# Patient Record
Sex: Male | Born: 1950 | Race: White | Hispanic: No | Marital: Single | Smoking: Never smoker
Health system: Southern US, Community
[De-identification: ages and names within clinical notes are randomized; demographics above are authoritative.]

## PROBLEM LIST (undated history)

## (undated) DIAGNOSIS — G8929 Other chronic pain: Secondary | ICD-10-CM

## (undated) DIAGNOSIS — I10 Essential (primary) hypertension: Secondary | ICD-10-CM

## (undated) DIAGNOSIS — E785 Hyperlipidemia, unspecified: Secondary | ICD-10-CM

## (undated) DIAGNOSIS — R51 Headache: Secondary | ICD-10-CM

## (undated) HISTORY — DX: Essential (primary) hypertension: I10

## (undated) HISTORY — DX: Other chronic pain: G89.29

## (undated) HISTORY — DX: Hyperlipidemia, unspecified: E78.5

## (undated) HISTORY — DX: Headache: R51

---

## 1980-10-05 HISTORY — PX: OTHER SURGICAL HISTORY: SHX169

## 1999-10-06 HISTORY — PX: REPAIR KNEE LIGAMENT: SUR1188

## 2004-11-19 ENCOUNTER — Ambulatory Visit: Payer: Self-pay | Admitting: Gastroenterology

## 2007-04-15 ENCOUNTER — Ambulatory Visit: Payer: Self-pay | Admitting: Unknown Physician Specialty

## 2009-03-06 ENCOUNTER — Ambulatory Visit: Payer: Self-pay | Admitting: Otolaryngology

## 2010-01-06 ENCOUNTER — Emergency Department: Payer: Self-pay | Admitting: Emergency Medicine

## 2011-02-02 IMAGING — CR DG ABDOMEN 3V
1 series · 5 of 5 positions shown · non-contrast
Comparison: none

REASON FOR EXAM: pain
COMMENTS:

[Series 1: view not recorded · 0.17mm/px · 5 of 5 slices shown]
[im 1/5]
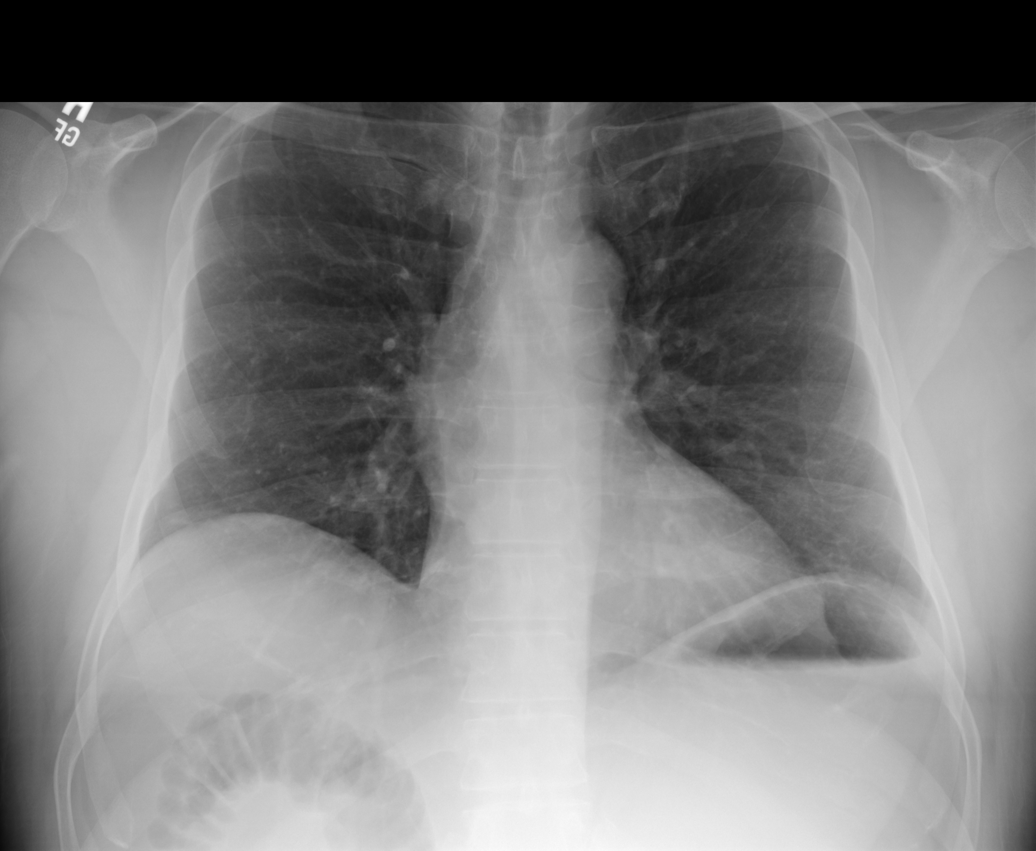
[im 2/5]
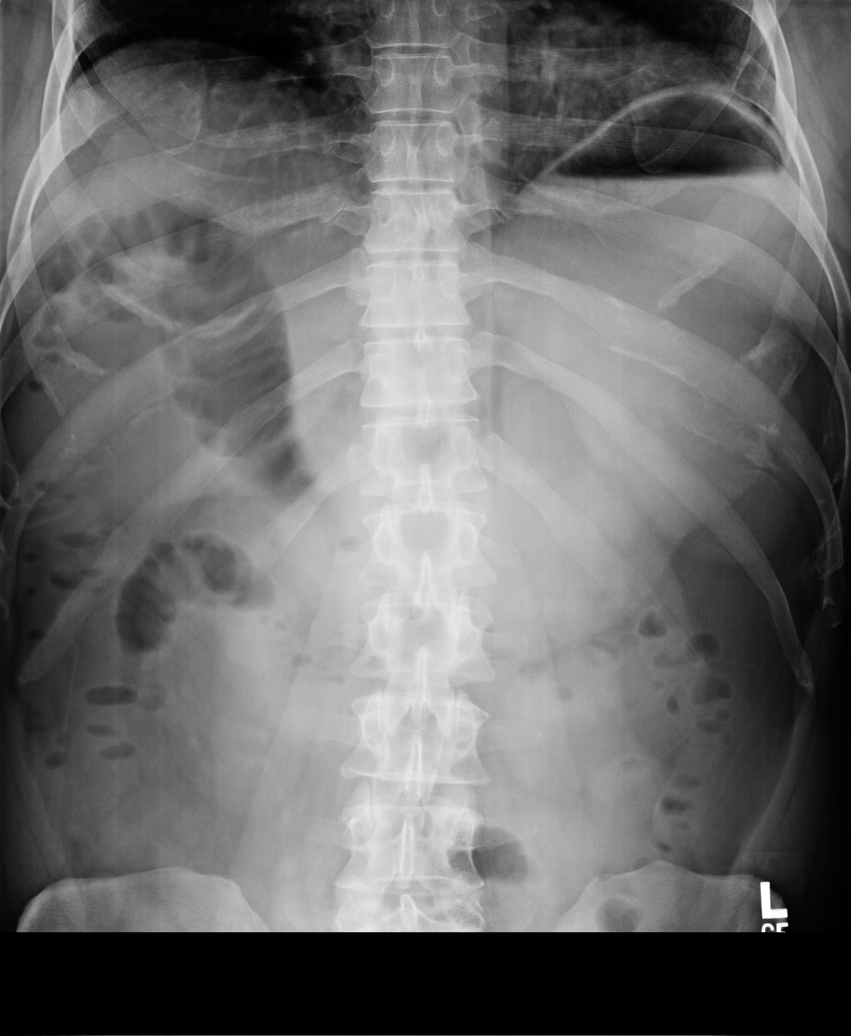
[im 3/5]
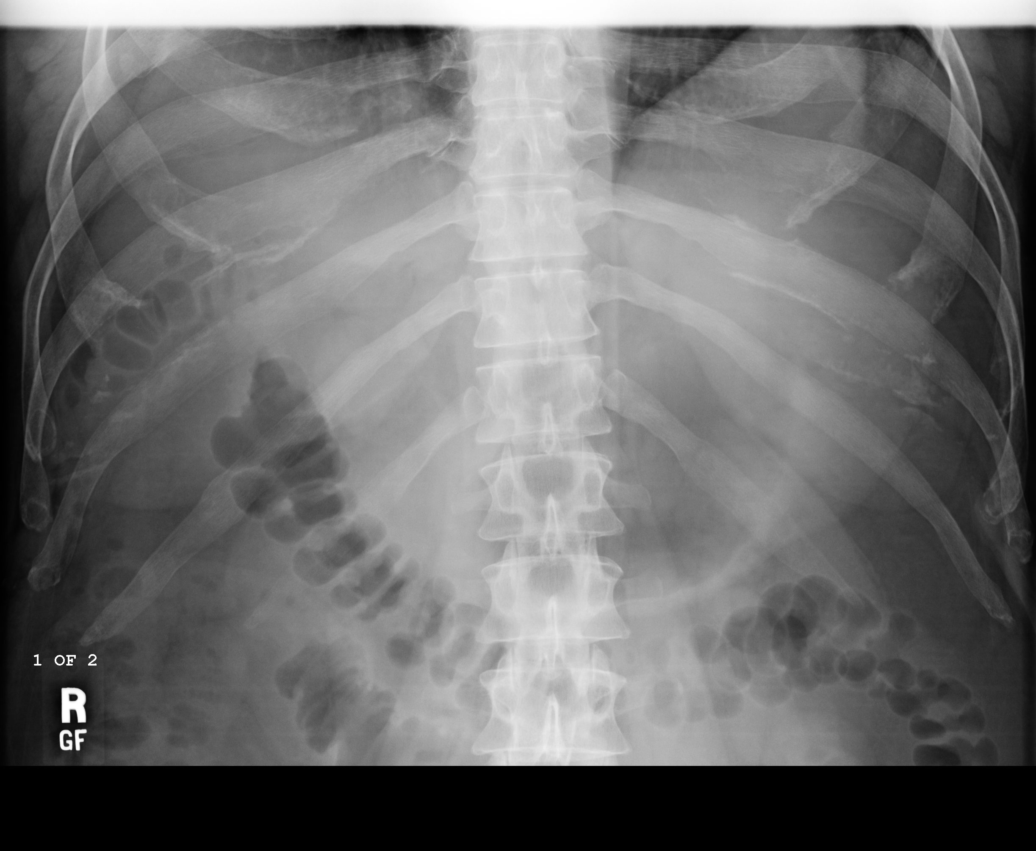
[im 4/5]
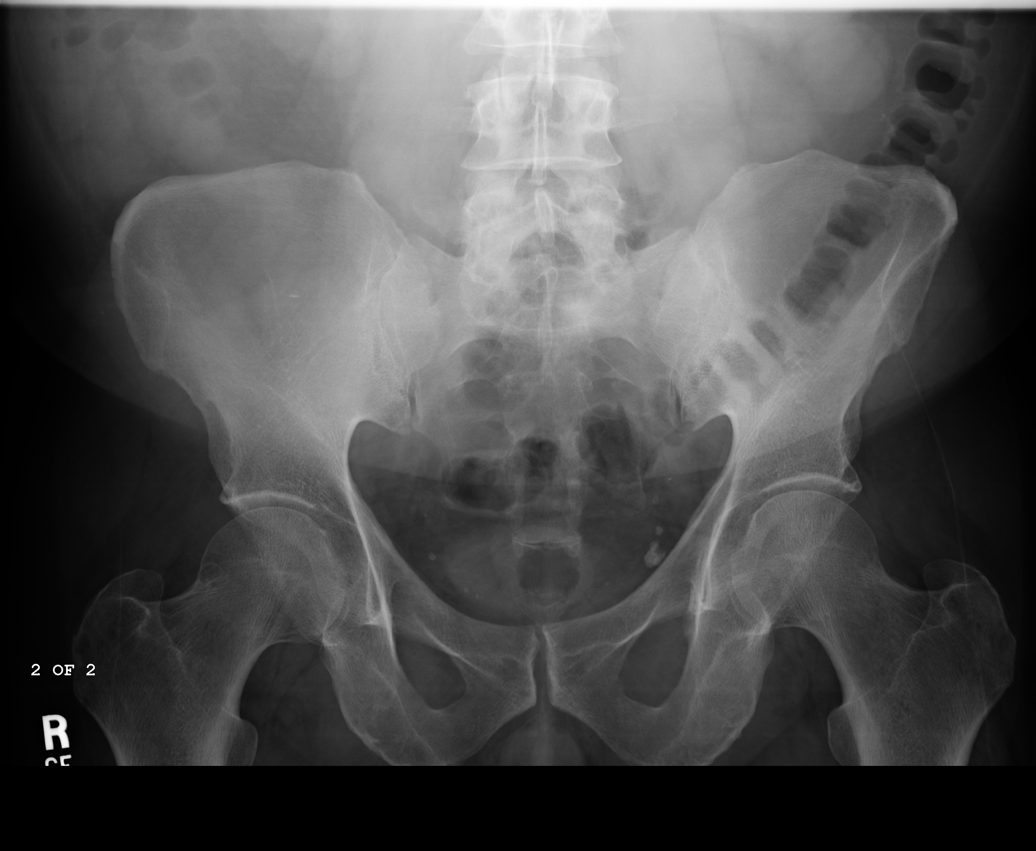
[im 5/5]
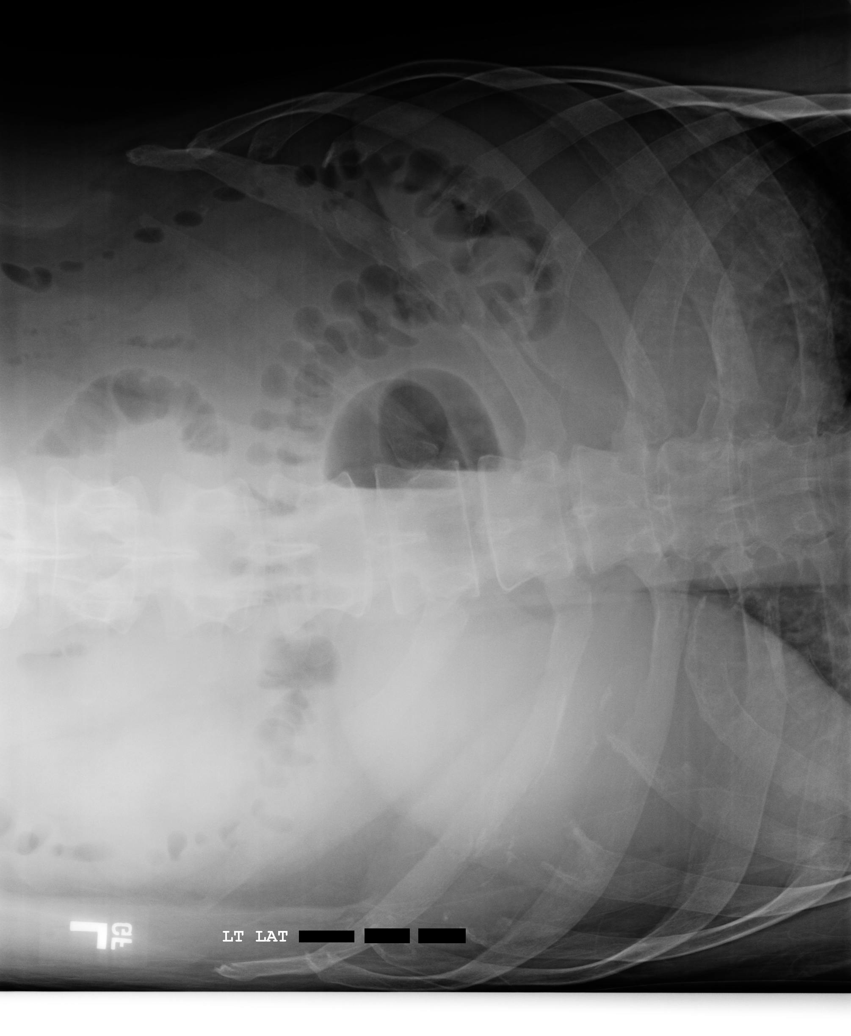

[5 of 5 positions shown; findings below may reference images not displayed]

PROCEDURE:     DXR - DXR ABDOMEN 3-WAY (INCL PA CXR)  - January 07, 2010  [DATE]

RESULT:     The lung fields are clear. The heart size is normal.

Four views of the abdomen are obtained. No subdiaphragmatic free air is
seen. Gas is visualized in both the large and small intestine. No bowel
obstruction is seen. The stomach is fluid filled and mildly distended.
Nonspecific air/fluid levels are seen in the duodenum and proximal small
bowel. The findings may be transient. Intra-abdominal inflammation such as
inflamed pancreas cannot be entirely excluded from the differential. No
abnormal intra-abdominal calcifications are seen. The osseous structures are
normal in appearance.
IMPRESSION: 1.  No subdiaphragmatic free air is seen.
2.  No bowel obstruction is noted.
3.  There are nonspecific fluid levels in the duodenum and proximal small
bowel. As mentioned, the findings may be transient but inflammation such as
inflamed pancreas cannot be excluded on the basis of this exam.
4.  The stomach is fluid filled and mildly distended.
5.  The lung fields are clear.

## 2011-05-12 ENCOUNTER — Ambulatory Visit: Payer: Self-pay | Admitting: Unknown Physician Specialty

## 2012-03-30 ENCOUNTER — Encounter (INDEPENDENT_AMBULATORY_CARE_PROVIDER_SITE_OTHER): Payer: Self-pay

## 2012-04-13 ENCOUNTER — Encounter (INDEPENDENT_AMBULATORY_CARE_PROVIDER_SITE_OTHER): Payer: Self-pay | Admitting: Surgery

## 2012-04-20 ENCOUNTER — Ambulatory Visit (INDEPENDENT_AMBULATORY_CARE_PROVIDER_SITE_OTHER): Payer: BC Managed Care – PPO | Admitting: Surgery

## 2012-05-11 ENCOUNTER — Ambulatory Visit (INDEPENDENT_AMBULATORY_CARE_PROVIDER_SITE_OTHER): Payer: BC Managed Care – PPO | Admitting: General Surgery

## 2012-05-11 ENCOUNTER — Encounter (INDEPENDENT_AMBULATORY_CARE_PROVIDER_SITE_OTHER): Payer: Self-pay | Admitting: General Surgery

## 2012-05-11 VITALS — BP 118/78 | HR 68 | Temp 97.3°F | Resp 16 | Ht 72.0 in | Wt 210.6 lb

## 2012-05-11 DIAGNOSIS — R51 Headache: Secondary | ICD-10-CM

## 2012-05-11 NOTE — Progress Notes (Signed)
Chief complaint: Right-sided headaches, rule out temporal arteritis  History: Patient is a 61 year old male referred by Dr. Marjory Lies for temporal artery biopsy. He has a long history of right-sided temporal headaches and tenderness over his right temple. He also has some history of recent vision changes. He was evaluated by his neurologist with negative workup and there is concern for temporal arteritis. He was placed on prednisone and initially did not have a response but with more long-term treatment has noted improvement in his symptoms.  Past Medical History  Diagnosis Date  . Hypertension   . Hyperlipidemia   . Chronic headaches    Past Surgical History  Procedure Date  . Left arm surgery 1982  . Repair knee ligament 2001   Current Outpatient Prescriptions  Medication Sig Dispense Refill  . aspirin 81 MG tablet Take 81 mg by mouth daily.      . hydrochlorothiazide (HYDRODIURIL) 25 MG tablet Take 25 mg by mouth daily.      Marland Kitchen lisinopril (PRINIVIL,ZESTRIL) 40 MG tablet Take 40 mg by mouth daily.      . metoprolol succinate (TOPROL-XL) 100 MG 24 hr tablet Take 100 mg by mouth daily. Take with or immediately following a meal.      . niacin (NIASPAN) 1000 MG CR tablet Take 1,000 mg by mouth at bedtime.      Marland Kitchen omeprazole (PRILOSEC) 20 MG capsule Take 20 mg by mouth daily.      Marland Kitchen PREDNISONE PO Take 30 mg by mouth daily.      . SUMAtriptan (IMITREX) 50 MG tablet Take 50 mg by mouth every 2 (two) hours as needed.       No Known Allergies Exam: Gen.: Healthy-appearing Caucasian male Pertinent exam today significant for a palpable right temporal artery. His area of headache and pain is somewhat anterior to this  Assessment and plan: Rule out temporal arteritis. We discussed right temporal artery biopsy including the nature of the surgery and recovery and risks of bleeding and infection and indications for the surgery all their questions were answered. We'll schedule his convenience.

## 2012-06-01 ENCOUNTER — Ambulatory Visit (INDEPENDENT_AMBULATORY_CARE_PROVIDER_SITE_OTHER): Payer: BC Managed Care – PPO | Admitting: Surgery

## 2012-06-06 IMAGING — CT CT PARANASAL SINUSES W/O CM
1 series · 15 of 30 positions shown, 19 images · non-contrast
Comparison: none

REASON FOR EXAM: recurrent sinusitis headaches
COMMENTS:

[Series 2: osteo (id) · axial · 0.40mm/px · z∈[-104,+74]mm · 15 of 129 slices shown, 19 images]
[im 5/129  brain]
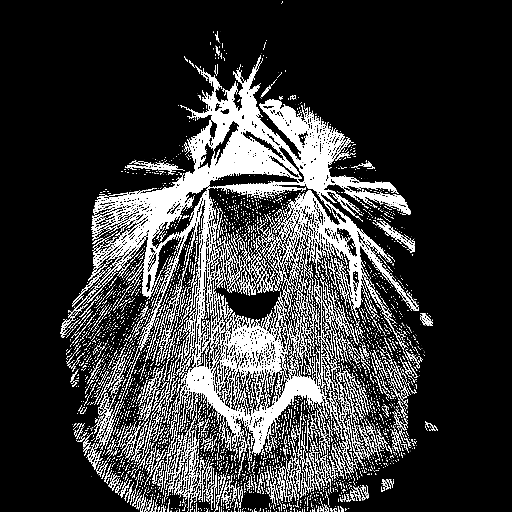
[im 5/129  bone]
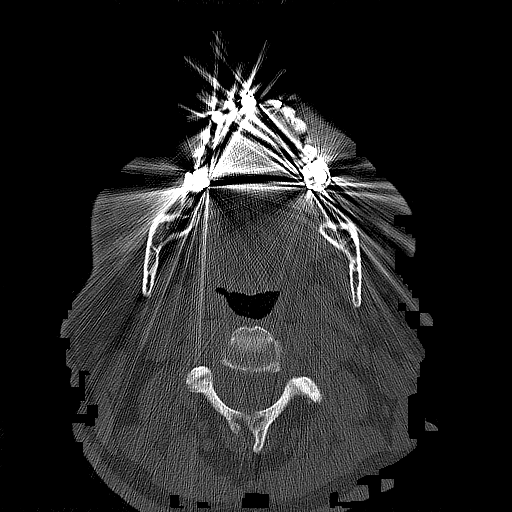
[im 14/129  bone]
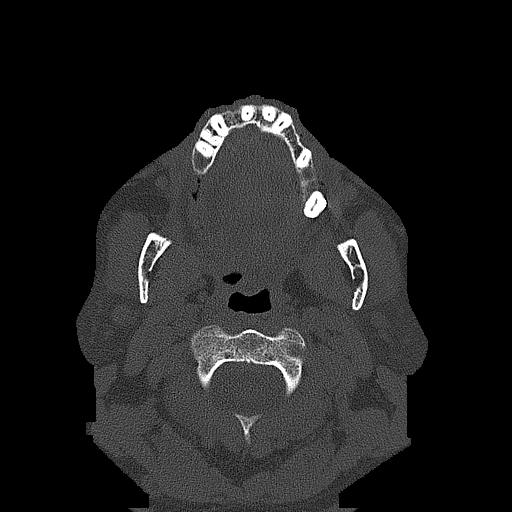
[im 23/129  bone]
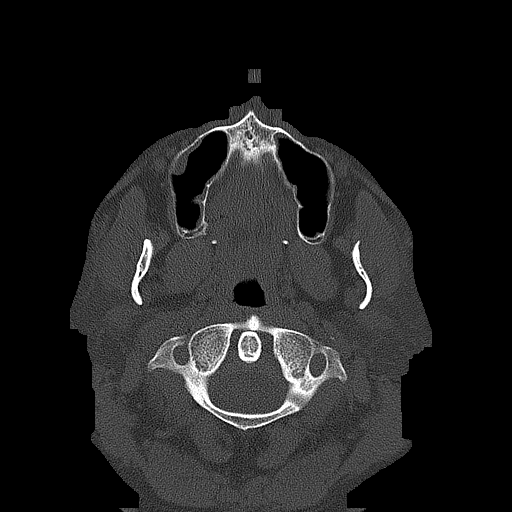
[im 31/129  bone]
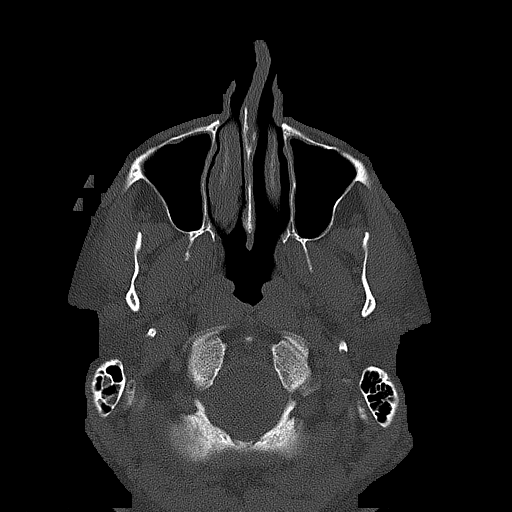
[im 40/129  brain]
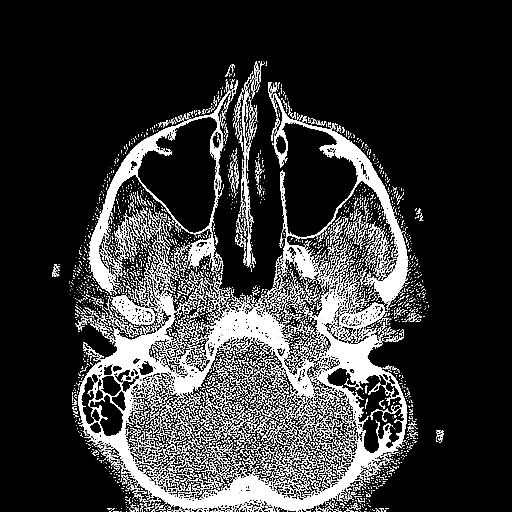
[im 40/129  bone]
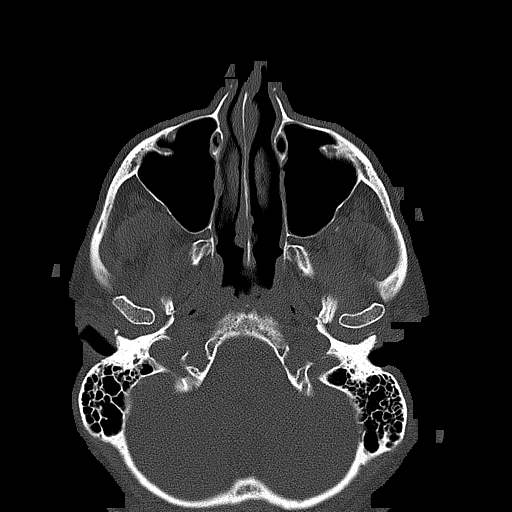
[im 49/129  bone]
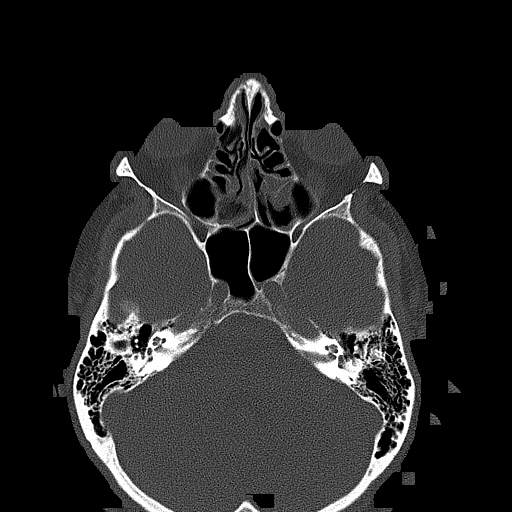
[im 58/129  bone]
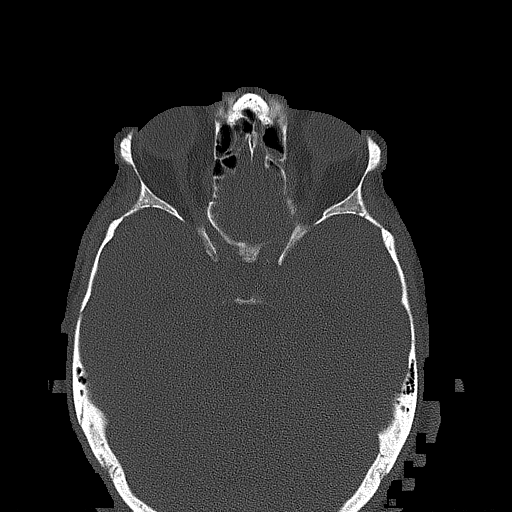
[im 67/129  bone]
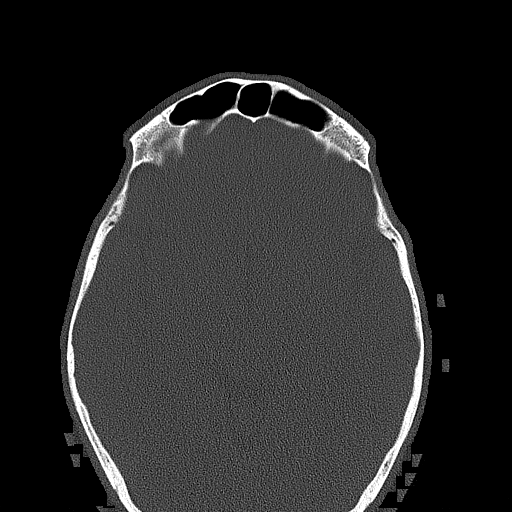
[im 71/129  brain]
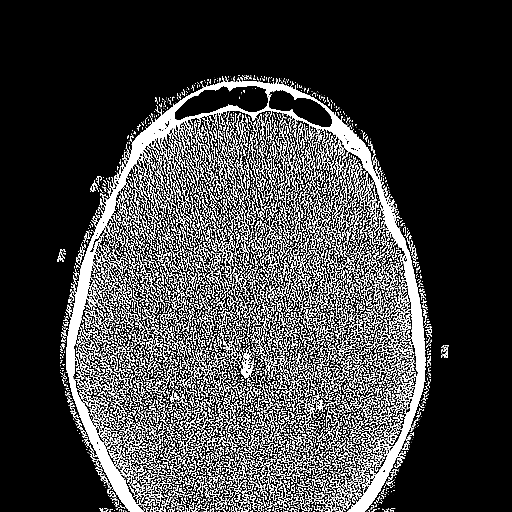
[im 71/129  bone]
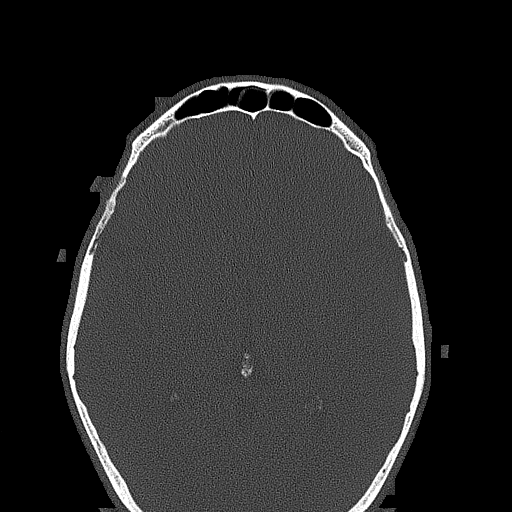
[im 80/129  bone]
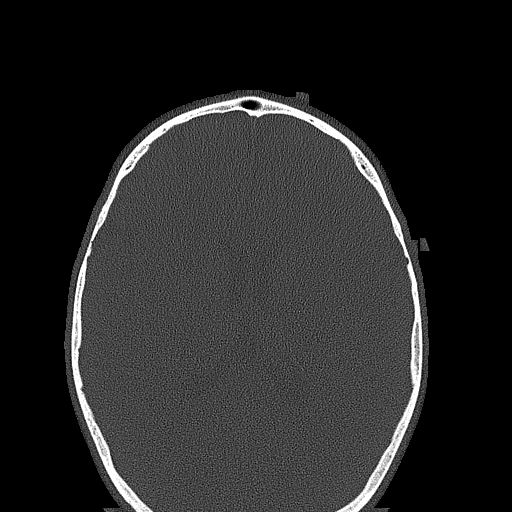
[im 89/129  bone]
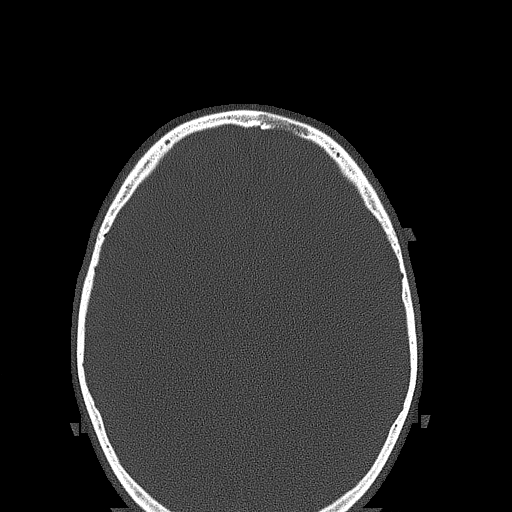
[im 98/129  bone]
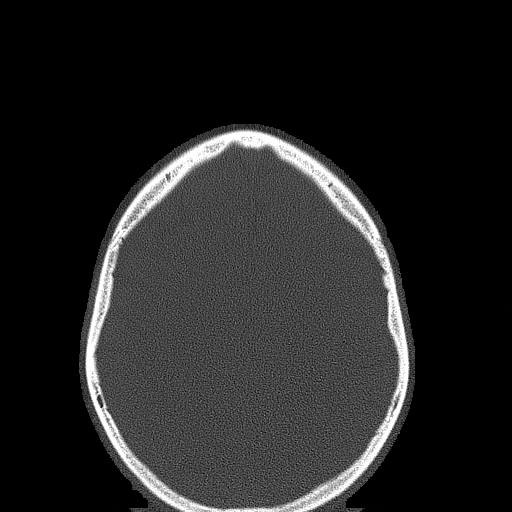
[im 106/129  brain]
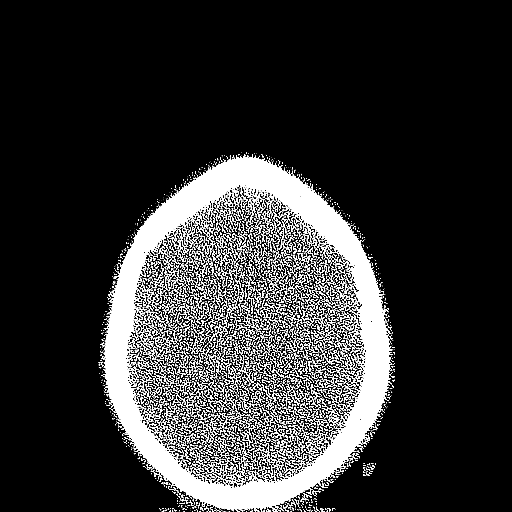
[im 106/129  bone]
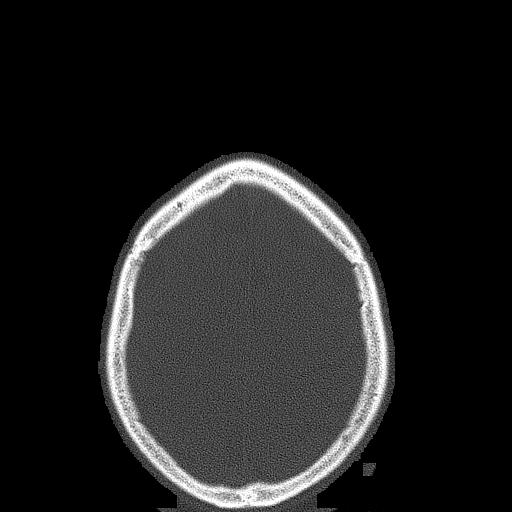
[im 115/129  bone]
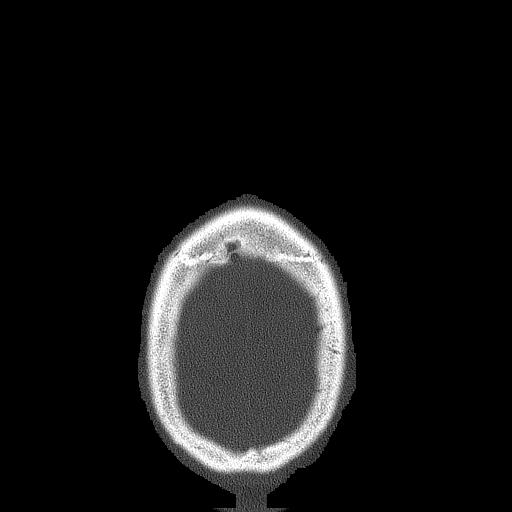
[im 124/129  bone]
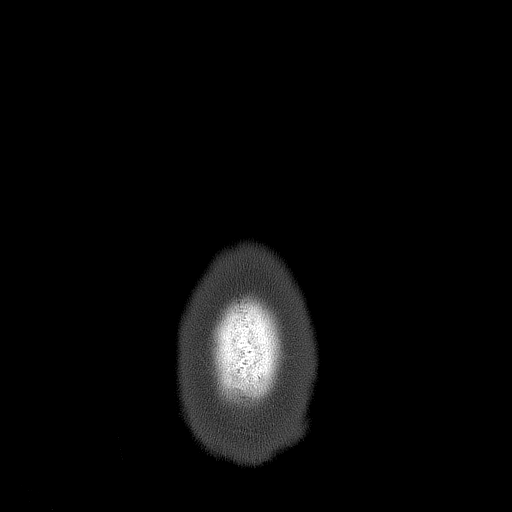

[15 of 30 positions shown; findings below may reference images not displayed]

PROCEDURE:     KCT - KCT SINUSES WITHOUT CONTRAST  - May 12, 2011  [DATE]

RESULT:     Axial imaging was performed through the paranasal sinuses with
reconstructions at 1.5 mm intervals and slice thicknesses using a bone
algorithm. Coronal reconstructions were then obtained.

The frontal sinuses are well pneumatized. A small amount of mucoperiosteal
thickening is present. There is mucoperiosteal thickening involving the
ethmoid sinuses bilaterally. A small amount of mucoperiosteal thickening is
seen inferiorly in the maxillary sinuses. The nasal septum is deviated
deviated slightly toward the right but does not appear obstructive. The
sphenoid sinuses exhibit no more than a trace of mucoperiosteal thickening.
IMPRESSION: 1. I see no air-fluid levels within the paranasal sinuses.
2. There are small amounts of mucoperiosteal thickening involving the
inferior aspects of the frontal sinuses, the ethmoid sinuses bilaterally,
and minimal amounts in the maxillary and sphenoid sinuses. The findings are
consistent with inflammation.

## 2012-06-07 ENCOUNTER — Ambulatory Visit (HOSPITAL_BASED_OUTPATIENT_CLINIC_OR_DEPARTMENT_OTHER)
Admission: RE | Admit: 2012-06-07 | Discharge: 2012-06-07 | Disposition: A | Discharge status: Home / Self Care | Payer: BC Managed Care – PPO | Source: Ambulatory Visit | Attending: General Surgery | Admitting: General Surgery

## 2012-06-07 ENCOUNTER — Encounter (HOSPITAL_BASED_OUTPATIENT_CLINIC_OR_DEPARTMENT_OTHER): Payer: Self-pay

## 2012-06-07 ENCOUNTER — Encounter (HOSPITAL_BASED_OUTPATIENT_CLINIC_OR_DEPARTMENT_OTHER)
Admission: RE | Disposition: A | Discharge status: Home / Self Care | Payer: Self-pay | Source: Ambulatory Visit | Attending: General Surgery

## 2012-06-07 DIAGNOSIS — R51 Headache: Secondary | ICD-10-CM | POA: Insufficient documentation

## 2012-06-07 HISTORY — PX: ARTERY BIOPSY: SHX891

## 2012-06-07 SURGERY — MINOR BIOPSY TEMPORAL ARTERY
Anesthesia: LOCAL | Site: Face | Laterality: Right | Wound class: Clean

## 2012-06-07 MED ORDER — BUPIVACAINE-EPINEPHRINE PF 0.25-1:200000 % IJ SOLN
INTRAMUSCULAR | Status: DC | PRN
  Administered 2012-06-07: 1 mL
  Administered 2012-06-07: .5 mL

## 2012-06-07 MED ORDER — HYDROCODONE-ACETAMINOPHEN 5-325 MG PO TABS
1.0000 | ORAL_TABLET | ORAL | Status: AC | PRN
Start: 2012-06-07 — End: 2012-06-17

## 2012-06-07 MED ORDER — LIDOCAINE HCL (PF) 1 % IJ SOLN
INTRAMUSCULAR | Status: DC | PRN
  Administered 2012-06-07: 1 mL
  Administered 2012-06-07: .5 mL

## 2012-06-07 MED ORDER — SODIUM BICARBONATE 4 % IV SOLN
INTRAVENOUS | Status: DC | PRN
  Administered 2012-06-07: 5 mL via INTRAVENOUS

## 2012-06-07 SURGICAL SUPPLY — 37 items
BENZOIN TINCTURE PRP APPL 2/3 (GAUZE/BANDAGES/DRESSINGS) IMPLANT
BLADE SURG 15 STRL LF DISP TIS (BLADE) ×1 IMPLANT
BLADE SURG 15 STRL SS (BLADE) ×1
BLADE SURG ROTATE 9660 (MISCELLANEOUS) ×2 IMPLANT
CLOTH BEACON ORANGE TIMEOUT ST (SAFETY) ×2 IMPLANT
DERMABOND ADVANCED (GAUZE/BANDAGES/DRESSINGS) ×1
DERMABOND ADVANCED .7 DNX12 (GAUZE/BANDAGES/DRESSINGS) ×1 IMPLANT
DRSG TEGADERM 4X4.75 (GAUZE/BANDAGES/DRESSINGS) IMPLANT
ELECT REM PT RETURN 9FT ADLT (ELECTROSURGICAL)
ELECTRODE REM PT RTRN 9FT ADLT (ELECTROSURGICAL) IMPLANT
GAUZE SPONGE 4X4 12PLY STRL LF (GAUZE/BANDAGES/DRESSINGS) ×2 IMPLANT
GAUZE SPONGE 4X4 16PLY XRAY LF (GAUZE/BANDAGES/DRESSINGS) IMPLANT
GLOVE BIO SURGEON STRL SZ7.5 (GLOVE) ×2 IMPLANT
GLOVE ECLIPSE 6.5 STRL STRAW (GLOVE) ×2 IMPLANT
GLOVE EUDERMIC 7 POWDERFREE (GLOVE) IMPLANT
MARKER SKIN DUAL TIP RULER LAB (MISCELLANEOUS) ×2 IMPLANT
NDL SAFETY ECLIPSE 18X1.5 (NEEDLE) ×1 IMPLANT
NEEDLE HYPO 18GX1.5 SHARP (NEEDLE) ×1
NEEDLE HYPO 25X1 1.5 SAFETY (NEEDLE) ×2 IMPLANT
NEEDLE HYPO 30X.5 LL (NEEDLE) ×2 IMPLANT
PENCIL BUTTON HOLSTER BLD 10FT (ELECTRODE) IMPLANT
STRIP CLOSURE SKIN 1/2X4 (GAUZE/BANDAGES/DRESSINGS) IMPLANT
SUT ETHILON 3 0 PS 1 (SUTURE) IMPLANT
SUT ETHILON 4 0 PS 2 18 (SUTURE) IMPLANT
SUT ETHILON 6 0 P 1 (SUTURE) ×2 IMPLANT
SUT MNCRL AB 4-0 PS2 18 (SUTURE) ×2 IMPLANT
SUT PROLENE 3 0 PS 2 (SUTURE) IMPLANT
SUT PROLENE 5 0 P 3 (SUTURE) IMPLANT
SUT SILK 0 TIES 10X30 (SUTURE) ×2 IMPLANT
SUT VIC AB 3-0 FS2 27 (SUTURE) IMPLANT
SUT VIC AB 4-0 BRD 54 (SUTURE) IMPLANT
SUT VIC AB 4-0 P-3 18XBRD (SUTURE) ×1 IMPLANT
SUT VIC AB 4-0 P3 18 (SUTURE) ×1
SUT VIC AB 4-0 SH 18 (SUTURE) IMPLANT
SWABSTICK POVIDONE IODINE SNGL (MISCELLANEOUS) ×6 IMPLANT
SYR CONTROL 10ML LL (SYRINGE) ×2 IMPLANT
TOWEL OR 17X24 6PK STRL BLUE (TOWEL DISPOSABLE) IMPLANT

## 2012-06-07 NOTE — Op Note (Signed)
Preoperative Diagnosis: rule out temporal arteritis  Postoprative Diagnosis: rule out temporal arteritis  Procedure: Procedure(s):  BIOPSY TEMPORAL ARTERY   Surgeon: Glenna Fellows T    Anesthesia:  Local anesthesia  Indications:   Patient is a 61 year old male with persistent right-sided headaches. His neurologist has requested a temporal artery biopsy to rule out temporal arteritis. We have discussed the indications for the procedure and risks of bleeding and infection. He is brought to the operating room for this procedure.  Procedure Detail:  Patient is brought to the operating room, placed in the supine position on the operating table. The right temporal area was sterilely prepped and draped and correct patient and procedure verified. Local anesthesia was used to infiltrate the skin and deeper tissue just anterior to the right ear. The temporal pulse was palpable and I made a longitudinal incision in the skin crease overlying this and dissection was carried down sharply through the subcutaneous tissue. Guided by the palpable pulse the fascia was incised and the temporal artery identified. It was dissected free and encircled with a silk tie and then followed proximally back until it started to take a deeper course. At this point the artery was doubly clamped and divided and tied with 3-0 silk. I then worked distally along the artery more superiorly. There were 2 similar sized branches that were excised working superiorly and dividing several small branches and tying these with silk. Eventually had a 3-1/2-4 cm segment and it was then clamped distally and removed and tied with 3-0 silk. The specimen was sent for permanent pathology. Hemostasis was assured. The skin was closed using a running 6-0 nylon. Patient tolerated procedure well.  Mariella Saa MD, FACS  06/07/2012, 3:25 PM

## 2012-06-07 NOTE — H&P (View-Only) (Signed)
Chief complaint: Right-sided headaches, rule out temporal arteritis  History: Patient is a 61-year-old male referred by Dr. Penumalli for temporal artery biopsy. He has a long history of right-sided temporal headaches and tenderness over his right temple. He also has some history of recent vision changes. He was evaluated by his neurologist with negative workup and there is concern for temporal arteritis. He was placed on prednisone and initially did not have a response but with more long-term treatment has noted improvement in his symptoms.  Past Medical History  Diagnosis Date  . Hypertension   . Hyperlipidemia   . Chronic headaches    Past Surgical History  Procedure Date  . Left arm surgery 1982  . Repair knee ligament 2001   Current Outpatient Prescriptions  Medication Sig Dispense Refill  . aspirin 81 MG tablet Take 81 mg by mouth daily.      . hydrochlorothiazide (HYDRODIURIL) 25 MG tablet Take 25 mg by mouth daily.      . lisinopril (PRINIVIL,ZESTRIL) 40 MG tablet Take 40 mg by mouth daily.      . metoprolol succinate (TOPROL-XL) 100 MG 24 hr tablet Take 100 mg by mouth daily. Take with or immediately following a meal.      . niacin (NIASPAN) 1000 MG CR tablet Take 1,000 mg by mouth at bedtime.      . omeprazole (PRILOSEC) 20 MG capsule Take 20 mg by mouth daily.      . PREDNISONE PO Take 30 mg by mouth daily.      . SUMAtriptan (IMITREX) 50 MG tablet Take 50 mg by mouth every 2 (two) hours as needed.       No Known Allergies Exam: Gen.: Healthy-appearing Caucasian male Pertinent exam today significant for a palpable right temporal artery. His area of headache and pain is somewhat anterior to this  Assessment and plan: Rule out temporal arteritis. We discussed right temporal artery biopsy including the nature of the surgery and recovery and risks of bleeding and infection and indications for the surgery all their questions were answered. We'll schedule his convenience. 

## 2012-06-07 NOTE — Interval H&P Note (Signed)
History and Physical Interval Note:  06/07/2012 2:01 PM  Jordan Reese  has presented today for surgery, with the diagnosis of rule out temporal arteritis  The various methods of treatment have been discussed with the patient and family. After consideration of risks, benefits and other options for treatment, the patient has consented to  Procedure(s) (LRB) with comments: MINOR BIOPSY TEMPORAL ARTERY (Right) as a surgical intervention .  The patient's history has been reviewed, patient examined, no change in status, stable for surgery.  I have reviewed the patient's chart and labs.  Questions were answered to the patient's satisfaction.     Carmilla Granville T

## 2012-06-08 ENCOUNTER — Encounter (HOSPITAL_BASED_OUTPATIENT_CLINIC_OR_DEPARTMENT_OTHER): Payer: Self-pay | Admitting: General Surgery

## 2012-06-14 ENCOUNTER — Encounter (INDEPENDENT_AMBULATORY_CARE_PROVIDER_SITE_OTHER): Payer: Self-pay

## 2012-06-14 ENCOUNTER — Ambulatory Visit (INDEPENDENT_AMBULATORY_CARE_PROVIDER_SITE_OTHER): Payer: BC Managed Care – PPO

## 2012-06-14 VITALS — BP 116/72 | HR 71 | Temp 97.6°F | Resp 16 | Ht 72.0 in | Wt 212.8 lb

## 2012-06-14 DIAGNOSIS — Z4802 Encounter for removal of sutures: Secondary | ICD-10-CM

## 2012-06-14 NOTE — Progress Notes (Signed)
Patient comes in today for suture removal (s/p Temporal Artery Biopsy)  Sutures removed, no redness or drainage.  Due to location of incision steri-strips were not placed, incision intact.  Patient tolerated well.  Patient has post op appointment for 06/28/12 w/Dr. Johna Sheriff.  Due to his busy work schedule he may cancel his follow up appointment if he seems to be doing well, patient will call our office before 06/28/12 if he decides to cancel.

## 2012-06-28 ENCOUNTER — Encounter (INDEPENDENT_AMBULATORY_CARE_PROVIDER_SITE_OTHER): Payer: BC Managed Care – PPO | Admitting: General Surgery

## 2016-05-07 ENCOUNTER — Encounter: Payer: Self-pay | Admitting: Family Medicine

## 2016-05-07 ENCOUNTER — Ambulatory Visit (INDEPENDENT_AMBULATORY_CARE_PROVIDER_SITE_OTHER): Payer: Self-pay | Admitting: Family Medicine

## 2016-05-07 DIAGNOSIS — I10 Essential (primary) hypertension: Secondary | ICD-10-CM

## 2016-05-07 NOTE — Progress Notes (Signed)
   BP 135/79 (BP Location: Left Arm, Patient Position: Sitting, Cuff Size: Normal)   Pulse (!) 54   Ht 6' 0.3" (1.836 m)   Wt 233 lb (105.7 kg)   BMI 31.34 kg/m    Subjective:    Patient ID: Jordan Reese, male    DOB: 1951-05-24, 65 y.o.   MRN: [id number]  HPI: Jordan Reese is a 65 y.o. male  Chief Complaint  Patient presents with  . Class 3 Flight Physical   Has hypertension which is well controlled Relevant past medical, surgical, family and social history reviewed and updated as indicated. Interim medical history since our last visit reviewed. Allergies and medications reviewed and updated.  Review of Systems  Per HPI unless specifically indicated above     Objective:    BP 135/79 (BP Location: Left Arm, Patient Position: Sitting, Cuff Size: Normal)   Pulse (!) 54   Ht 6' 0.3" (1.836 m)   Wt 233 lb (105.7 kg)   BMI 31.34 kg/m   Wt Readings from Last 3 Encounters:  05/07/16 233 lb (105.7 kg)  06/14/12 212 lb 12.8 oz (96.5 kg)  05/11/12 210 lb 9.6 oz (95.5 kg)    Physical Exam  Constitutional: He is oriented to person, place, and time. He appears well-developed and well-nourished.  HENT:  Head: Normocephalic and atraumatic.  Right Ear: External ear normal.  Left Ear: External ear normal.  Eyes: Conjunctivae and EOM are normal. Pupils are equal, round, and reactive to light.  Neck: Normal range of motion. Neck supple.  Cardiovascular: Normal rate, regular rhythm, normal heart sounds and intact distal pulses.   Pulmonary/Chest: Effort normal and breath sounds normal.  Abdominal: Soft. Bowel sounds are normal. There is no splenomegaly or hepatomegaly.  Genitourinary: Penis normal.  Musculoskeletal: Normal range of motion.  Neurological: He is alert and oriented to person, place, and time. He has normal reflexes.  Skin: No rash noted. No erythema.  Psychiatric: He has a normal mood and affect. His behavior is normal. Judgment and thought content normal.    No  results found for this or any previous visit.    Assessment & Plan:   Problem List Items Addressed This Visit    None    Visit Diagnoses   None.      Follow up plan: Return if symptoms worsen or fail to improve.

## 2021-07-14 ENCOUNTER — Encounter: Payer: Self-pay | Admitting: *Deleted

## 2021-07-15 ENCOUNTER — Encounter
Admission: RE | Disposition: A | Discharge status: Home / Self Care | Payer: Self-pay | Source: Home / Self Care | Attending: Gastroenterology

## 2021-07-15 ENCOUNTER — Ambulatory Visit
Admission: RE | Admit: 2021-07-15 | Discharge: 2021-07-15 | Disposition: A | Discharge status: Home / Self Care | Payer: Medicare HMO | Attending: Gastroenterology | Admitting: Gastroenterology

## 2021-07-15 ENCOUNTER — Ambulatory Visit: Payer: Medicare HMO | Admitting: Anesthesiology

## 2021-07-15 ENCOUNTER — Encounter: Payer: Self-pay | Admitting: Anesthesiology

## 2021-07-15 DIAGNOSIS — I10 Essential (primary) hypertension: Secondary | ICD-10-CM | POA: Insufficient documentation

## 2021-07-15 DIAGNOSIS — Z1211 Encounter for screening for malignant neoplasm of colon: Secondary | ICD-10-CM | POA: Insufficient documentation

## 2021-07-15 DIAGNOSIS — Z7982 Long term (current) use of aspirin: Secondary | ICD-10-CM | POA: Diagnosis not present

## 2021-07-15 DIAGNOSIS — K640 First degree hemorrhoids: Secondary | ICD-10-CM | POA: Diagnosis not present

## 2021-07-15 DIAGNOSIS — Z79899 Other long term (current) drug therapy: Secondary | ICD-10-CM | POA: Diagnosis not present

## 2021-07-15 HISTORY — PX: COLONOSCOPY WITH PROPOFOL: SHX5780

## 2021-07-15 SURGERY — COLONOSCOPY WITH PROPOFOL
Anesthesia: General

## 2021-07-15 MED ORDER — SODIUM CHLORIDE 0.9 % IV SOLN
INTRAVENOUS | Status: DC
  Administered 2021-07-15: 1000 mL via INTRAVENOUS

## 2021-07-15 MED ORDER — PROPOFOL 500 MG/50ML IV EMUL
INTRAVENOUS | Status: DC | PRN
  Administered 2021-07-15: 150 ug/kg/min via INTRAVENOUS

## 2021-07-15 NOTE — Interval H&P Note (Signed)
History and Physical Interval Note:  07/15/2021 9:01 AM  Jordan Reese  has presented today for surgery, with the diagnosis of personal history colon polyp.  The various methods of treatment have been discussed with the patient and family. After consideration of risks, benefits and other options for treatment, the patient has consented to  Procedure(s): COLONOSCOPY WITH PROPOFOL (N/A) as a surgical intervention.  The patient's history has been reviewed, patient examined, no change in status, stable for surgery.  I have reviewed the patient's chart and labs.  Questions were answered to the patient's satisfaction.     Regis Bill  Ok to proceed with colonoscopy

## 2021-07-15 NOTE — H&P (Signed)
Outpatient short stay form Pre-procedure 07/15/2021  Regis Bill, MD  Primary Physician: Dortha Kern, MD  Reason for visit:  Surveillance colonoscopy  History of present illness:   70 y/o gentleman with history of hypertension here for surveillance colonoscopy. No blood thinners. No significant abdominal surgeries. No family history of GI malignancies.     Current Facility-Administered Medications:    0.9 %  sodium chloride infusion, , Intravenous, Continuous, Javaris Wigington, Rossie Muskrat, MD, Last Rate: 20 mL/hr at 07/15/21 0849, 1,000 mL at 07/15/21 0849  Medications Prior to Admission  Medication Sig Dispense Refill Last Dose   aspirin 81 MG tablet Take 81 mg by mouth daily.   Past Week   Ergocalciferol (VITAMIN D2 PO) Take by mouth daily.   Past Week   hydrochlorothiazide (HYDRODIURIL) 25 MG tablet Take 25 mg by mouth daily.   07/14/2021   lisinopril (PRINIVIL,ZESTRIL) 40 MG tablet Take 40 mg by mouth daily.   07/14/2021   metoprolol succinate (TOPROL-XL) 100 MG 24 hr tablet Take 100 mg by mouth daily. Take with or immediately following a meal.   07/14/2021   Multiple Vitamins-Minerals (ZINC PO) Take by mouth daily.   Past Week   niacin (NIASPAN) 1000 MG CR tablet Take 1,000 mg by mouth at bedtime.   Past Week   Omega-3 Fatty Acids (OMEGA 3 500 PO) Take by mouth daily.   Past Week   omeprazole (PRILOSEC) 20 MG capsule Take 20 mg by mouth daily.   Past Week   potassium chloride (MICRO-K) 10 MEQ CR capsule Take 10 mEq by mouth 2 (two) times daily.   Past Week   PREDNISONE PO Take 30 mg by mouth daily.   Past Week   SUMAtriptan (IMITREX) 50 MG tablet Take 50 mg by mouth every 2 (two) hours as needed.   07/14/2021   Aspirin-Calcium Carbonate 81-777 MG TABS Take by mouth. (Patient not taking: Reported on 07/15/2021)   Not Taking     No Known Allergies   Past Medical History:  Diagnosis Date   Chronic headaches    Hyperlipidemia    Hypertension     Review of systems:   Otherwise negative.    Physical Exam  Gen: Alert, oriented. Appears stated age.  HEENT: PERRLA. Lungs: No respiratory distress CV: RRR Abd: soft, benign, no masses Ext: No edema    Planned procedures: Proceed with colonoscopy. The patient understands the nature of the planned procedure, indications, risks, alternatives and potential complications including but not limited to bleeding, infection, perforation, damage to internal organs and possible oversedation/side effects from anesthesia. The patient agrees and gives consent to proceed.  Please refer to procedure notes for findings, recommendations and patient disposition/instructions.     Regis Bill, MD [facility]

## 2021-07-15 NOTE — Transfer of Care (Signed)
Immediate Anesthesia Transfer of Care Note  Patient: Jordan Reese  Procedure(s) Performed: COLONOSCOPY WITH PROPOFOL  Patient Location: PACU  Anesthesia Type:General  Level of Consciousness: awake and sedated  Airway & Oxygen Therapy: Patient Spontanous Breathing and Patient connected to face mask oxygen  Post-op Assessment: Report given to RN and Post -op Vital signs reviewed and stable  Post vital signs: Reviewed and stable  Last Vitals:  Vitals Value Taken Time  BP    Temp    Pulse    Resp    SpO2      Last Pain:  Vitals:   07/15/21 0835  TempSrc: Temporal  PainSc: 2          Complications: No notable events documented.

## 2021-07-15 NOTE — Anesthesia Procedure Notes (Signed)
Date/Time: 07/15/2021 9:11 AM Performed by: Tonia Ghent Pre-anesthesia Checklist: Patient identified, Emergency Drugs available, Suction available, Patient being monitored and Timeout performed Patient Re-evaluated:Patient Re-evaluated prior to induction Oxygen Delivery Method: Nasal cannula Preoxygenation: Pre-oxygenation with 100% oxygen Induction Type: IV induction Placement Confirmation: positive ETCO2 and CO2 detector

## 2021-07-15 NOTE — Anesthesia Postprocedure Evaluation (Signed)
Anesthesia Post Note  Patient: Jordan Reese  Procedure(s) Performed: COLONOSCOPY WITH PROPOFOL  Patient location during evaluation: Phase II Anesthesia Type: General Level of consciousness: awake and alert, awake and oriented Pain management: pain level controlled Vital Signs Assessment: post-procedure vital signs reviewed and stable Respiratory status: spontaneous breathing, nonlabored ventilation and respiratory function stable Cardiovascular status: blood pressure returned to baseline and stable Postop Assessment: no apparent nausea or vomiting Anesthetic complications: no   No notable events documented.   Last Vitals:  Vitals:   07/15/21 0937 07/15/21 0947  BP: (!) 141/87 (!) 159/85  Pulse: (!) 55 (!) 51  Resp: 11 (!) 8  Temp:    SpO2: 98% 99%    Last Pain:  Vitals:   07/15/21 0947  TempSrc:   PainSc: 0-No pain                 Manfred Arch

## 2021-07-15 NOTE — Op Note (Signed)
[facility] Patient Name: Jordan Reese Procedure Date: 07/15/2021 8:49 AM MRN: [id number] Account #: [id number] Date of Birth: 1950/10/12 Admit Type: Outpatient Age: 70 Room: [facility] ENDO ROOM 1 Gender: Male Note Status: Finalized Instrument Name: Park Meo [id number] Procedure:             Colonoscopy Indications:           Surveillance: Personal history of adenomatous polyps                         on last colonoscopy > 5 years ago Providers:             Andrey Farmer MD, MD Referring MD:          Lynnell Jude (Referring MD) Medicines:             Monitored Anesthesia Care Complications:         No immediate complications. Procedure:             Pre-Anesthesia Assessment:                        - Prior to the procedure, a History and Physical was                         performed, and patient medications and allergies were                         reviewed. The patient is competent. The risks and                         benefits of the procedure and the sedation options and                         risks were discussed with the patient. All questions                         were answered and informed consent was obtained.                         Patient identification and proposed procedure were                         verified by the physician, the nurse, the anesthetist                         and the technician in the endoscopy suite. Mental                         Status Examination: alert and oriented. Airway                         Examination: normal oropharyngeal airway and neck                         mobility. Respiratory Examination: clear to                         auscultation. CV Examination: normal. Prophylactic  Antibiotics: The patient does not require prophylactic                         antibiotics. Prior Anticoagulants: The patient has                         taken no previous anticoagulant or antiplatelet                          agents. ASA Grade Assessment: II - A patient with mild                         systemic disease. After reviewing the risks and                         benefits, the patient was deemed in satisfactory                         condition to undergo the procedure. The anesthesia                         plan was to use monitored anesthesia care (MAC).                         Immediately prior to administration of medications,                         the patient was re-assessed for adequacy to receive                         sedatives. The heart rate, respiratory rate, oxygen                         saturations, blood pressure, adequacy of pulmonary                         ventilation, and response to care were monitored                         throughout the procedure. The physical status of the                         patient was re-assessed after the procedure.                        After obtaining informed consent, the colonoscope was                         passed under direct vision. Throughout the procedure,                         the patient's blood pressure, pulse, and oxygen                         saturations were monitored continuously. The                         Colonoscope was introduced through the anus and  advanced to the the cecum, identified by appendiceal                         orifice and ileocecal valve. The colonoscopy was                         performed without difficulty. The patient tolerated                         the procedure well. The quality of the bowel                         preparation was good. Findings:      The perianal and digital rectal examinations were normal.      Internal hemorrhoids were found during retroflexion. The hemorrhoids       were Grade I (internal hemorrhoids that do not prolapse).      The exam was otherwise without abnormality on direct and retroflexion       views. Impression:             - Internal hemorrhoids.                        - The examination was otherwise normal on direct and                         retroflexion views.                        - No specimens collected. Recommendation:        - Discharge patient to home.                        - Resume previous diet.                        - Continue present medications.                        - Repeat colonoscopy in 10 years for surveillance.                        - Return to referring physician as previously                         scheduled. Procedure Code(s):     --- Professional ---                        K5537, Colorectal cancer screening; colonoscopy on                         individual at high risk Diagnosis Code(s):     --- Professional ---                        Z86.010, Personal history of colonic polyps                        K64.0, First degree hemorrhoids CPT copyright 2019 [employer]. All rights reserved. The codes documented in this report are preliminary and upon coder review may  be revised to meet current compliance requirements. Andrey Farmer MD, MD 07/15/2021 9:25:21 AM Number of Addenda: 0 Note Initiated On: 07/15/2021 8:49 AM Scope Withdrawal Time: 0 hours 9 minutes 35 seconds  Total Procedure Duration: 0 hours 14 minutes 30 seconds  Estimated Blood Loss:  Estimated blood loss: none.      [facility]

## 2021-07-15 NOTE — Anesthesia Preprocedure Evaluation (Signed)
Anesthesia Evaluation  Patient identified by MRN, date of birth, ID band Patient awake    Reviewed: Allergy & Precautions, NPO status , Patient's Chart, lab work & pertinent test results, reviewed documented beta blocker date and time   Airway Mallampati: II  TM Distance: >3 FB Neck ROM: Full    Dental no notable dental hx.    Pulmonary neg pulmonary ROS,    Pulmonary exam normal        Cardiovascular hypertension, Pt. on medications and Pt. on home beta blockers Normal cardiovascular exam+ Valvular Problems/Murmurs MR and AI      Neuro/Psych  Headaches, negative psych ROS   GI/Hepatic negative GI ROS, Neg liver ROS, Bowel prep,  Endo/Other  negative endocrine ROS  Renal/GU negative Renal ROS  negative genitourinary   Musculoskeletal negative musculoskeletal ROS (+)   Abdominal   Peds negative pediatric ROS (+)  Hematology negative hematology ROS (+)   Anesthesia Other Findings 1. Essential hypertension  -Slightly elevated in the office today, but has improved on HCTZ 25mg  daily, lisinopril 40mg  daily, and metoprolol 100mg  daily  -Continue to adhere to a low sodium diet  -Routine exercise recommended  2. Valvular heart disease  -Mild AR, mild MR per recent echocardiogram; will continue to monitor on a regular basis or sooner if symptoms develop  3. Hyperlipidemia, mixed  -Lipid panel scheduled     Reproductive/Obstetrics negative OB ROS                             Anesthesia Physical Anesthesia Plan  ASA: 3  Anesthesia Plan: General   Post-op Pain Management:    Induction: Intravenous  PONV Risk Score and Plan: 2 and Propofol infusion and TIVA  Airway Management Planned: Natural Airway and Nasal Cannula  Additional Equipment:   Intra-op Plan:   Post-operative Plan:   Informed Consent: I have reviewed the patients History and Physical, chart, labs and discussed the  procedure including the risks, benefits and alternatives for the proposed anesthesia with the patient or authorized representative who has indicated his/her understanding and acceptance.       Plan Discussed with: CRNA, Anesthesiologist and Surgeon  Anesthesia Plan Comments:         Anesthesia Quick Evaluation

## 2021-07-17 ENCOUNTER — Encounter: Payer: Self-pay | Admitting: Gastroenterology
